# Patient Record
Sex: Female | Born: 1990 | Hispanic: No | Marital: Married | Smoking: Never smoker
Health system: Southern US, Community
[De-identification: ages and names within clinical notes are randomized; demographics above are authoritative.]

## PROBLEM LIST (undated history)

## (undated) ENCOUNTER — Inpatient Hospital Stay (HOSPITAL_COMMUNITY): Payer: Self-pay

## (undated) DIAGNOSIS — Z789 Other specified health status: Secondary | ICD-10-CM

## (undated) HISTORY — DX: Other specified health status: Z78.9

## (undated) HISTORY — PX: NO PAST SURGERIES: SHX2092

---

## 2016-10-24 ENCOUNTER — Inpatient Hospital Stay (HOSPITAL_COMMUNITY): Payer: Medicaid Other

## 2016-10-24 ENCOUNTER — Encounter (HOSPITAL_COMMUNITY): Payer: Self-pay

## 2016-10-24 ENCOUNTER — Inpatient Hospital Stay (HOSPITAL_COMMUNITY)
Admission: AD | Admit: 2016-10-24 | Discharge: 2016-10-24 | Disposition: A | Discharge status: Home / Self Care | Payer: Medicaid Other | Source: Ambulatory Visit | Attending: Obstetrics & Gynecology | Admitting: Obstetrics & Gynecology

## 2016-10-24 DIAGNOSIS — O283 Abnormal ultrasonic finding on antenatal screening of mother: Secondary | ICD-10-CM | POA: Diagnosis not present

## 2016-10-24 DIAGNOSIS — Z3A33 33 weeks gestation of pregnancy: Secondary | ICD-10-CM | POA: Diagnosis not present

## 2016-10-24 DIAGNOSIS — O26893 Other specified pregnancy related conditions, third trimester: Secondary | ICD-10-CM | POA: Diagnosis not present

## 2016-10-24 DIAGNOSIS — R109 Unspecified abdominal pain: Secondary | ICD-10-CM | POA: Diagnosis present

## 2016-10-24 DIAGNOSIS — Z8759 Personal history of other complications of pregnancy, childbirth and the puerperium: Secondary | ICD-10-CM

## 2016-10-24 LAB — URINE MICROSCOPIC-ADD ON
RBC / HPF: NONE SEEN RBC/hpf (ref 0–5)
WBC UA: NONE SEEN WBC/hpf (ref 0–5)

## 2016-10-24 LAB — URINALYSIS, ROUTINE W REFLEX MICROSCOPIC
BILIRUBIN URINE: NEGATIVE
Glucose, UA: NEGATIVE mg/dL
KETONES UR: NEGATIVE mg/dL
Leukocytes, UA: NEGATIVE
NITRITE: NEGATIVE
PH: 7 (ref 5.0–8.0)
Protein, ur: NEGATIVE mg/dL
Specific Gravity, Urine: <1.005 — ABNORMAL LOW (ref 1.005–1.030)

## 2016-10-24 NOTE — MAU Note (Addendum)
Onset of constant lower abdominal pain since last night, denies vaginal bleeding, positive FM. Patient has a Grade 3 placenta and has her records with her.

## 2016-10-24 NOTE — Discharge Instructions (Signed)
Abdominal Pain During Pregnancy °Belly (abdominal) pain is common during pregnancy. Most of the time, it is not a serious problem. Other times, it can be a sign that something is wrong with the pregnancy. Always tell your doctor if you have belly pain. °Follow these instructions at home: °Monitor your belly pain for any changes. The following actions may help you feel better: °· Do not have sex (intercourse) or put anything in your vagina until you feel better. °· Rest until your pain stops. °· Drink clear fluids if you feel sick to your stomach (nauseous). Do not eat solid food until you feel better. °· Only take medicine as told by your doctor. °· Keep all doctor visits as told. °Get help right away if: °· You are bleeding, leaking fluid, or pieces of tissue come out of your vagina. °· You have more pain or cramping. °· You keep throwing up (vomiting). °· You have pain when you pee (urinate) or have blood in your pee. °· You have a fever. °· You do not feel your baby moving as much. °· You feel very weak or feel like passing out. °· You have trouble breathing, with or without belly pain. °· You have a very bad headache and belly pain. °· You have fluid leaking from your vagina and belly pain. °· You keep having watery poop (diarrhea). °· Your belly pain does not go away after resting, or the pain gets worse. °This information is not intended to replace advice given to you by your health care provider. Make sure you discuss any questions you have with your health care provider. °Document Released: 11/11/2009 Document Revised: 07/01/2016 Document Reviewed: 06/22/2013 °Elsevier Interactive Patient Education © 2017 Elsevier Inc. ° °

## 2016-10-24 NOTE — MAU Provider Note (Signed)
History     CSN: [id number]  Arrival date and time: 10/24/16 1212   None     Chief Complaint  Patient presents with  . Abdominal Pain   Patient is a 25 year old female G1 P0 at 33 weeks and 5 days by LMP and early ultrasound. Patient moved here very recently from [place]. She is undergoing prenatal care at that time there. She has had regular ultrasounds. She does have a grade 3 placenta and has intermittently had oligo through her pregnancy. An ultrasound was reviewed from 11/8 that showed normal AFI but grade 3 placenta.. Additionally showed normal growth.  She reports last night she started with upper abdominal pain worse in the right upper quadrant. She reports its constant and achy in nature. It does worsen with palpation. She denies any vaginal bleeding or contractions. She reports regular fetal movement. She has not had pain similar to this before. She reports regular bowel movements and no dysuria.    OB History    Gravida Para Term Preterm AB Living   1             SAB TAB Ectopic Multiple Live Births                  History reviewed. No pertinent past medical history.  No past surgical history on file.  No family history on file.  Social History  Substance Use Topics  . Smoking status: Not on file  . Smokeless tobacco: Not on file  . Alcohol use Not on file    Allergies: No Known Allergies  Prescriptions Prior to Admission  Medication Sig Dispense Refill Last Dose  . Prenatal Vit-Fe Fumarate-FA (PRENATAL MULTIVITAMIN) TABS tablet Take 1 tablet by mouth at bedtime.    10/23/2016 at Unknown time    Review of Systems  Constitutional: Negative for chills and fever.  HENT: Negative for congestion and hearing loss.   Eyes: Negative for blurred vision and double vision.  Respiratory: Negative for cough and sputum production.   Cardiovascular: Negative for chest pain and orthopnea.  Gastrointestinal: Positive for abdominal pain. Negative for constipation,  diarrhea, heartburn, nausea and vomiting.  Genitourinary: Negative for dysuria, frequency and urgency.  Musculoskeletal: Negative for back pain, myalgias and neck pain.  Skin: Negative for itching and rash.  Neurological: Negative for dizziness and headaches.   Physical Exam   Blood pressure 142/76, pulse 113, temperature 98.2 F (36.8 C), temperature source Oral, height 5\' 2"  (1.575 m), weight 146 lb (66.2 kg).  Physical Exam  Constitutional: She is oriented to person, place, and time. She appears well-developed and well-nourished.  HENT:  Head: Normocephalic and atraumatic.  Cardiovascular: Normal rate and intact distal pulses.   Respiratory: Effort normal. No respiratory distress.  GI: Soft. Bowel sounds are normal. She exhibits no distension. There is no tenderness. There is no rebound.  Genitourinary:  Genitourinary Comments: Patient with closed cervix on digital exam.  Neurological: She is alert and oriented to person, place, and time.  Skin: Skin is warm and dry.  Psychiatric: She has a normal mood and affect. Her behavior is normal.    MAU Course  Procedures  MDM IN MAU patietn underwent fetal monitoring with reactive NST. Records from [place] where reviewed and history of Oligohydramnios and grade 3 placenta where noted. US preformed today showed 8/8 BPP and EFW in the 36% on preliminary reading.   Pain is not worsening and likely is due to fetal position. Cervix is closed.   Assessment  and Plan  1. Abdominal pain in pregnancy: likely from fetal position 2. History of grade 3 placenta and oligohydramnios. US reassuring today.   Ernestina Pennaicholas [employer] 10/24/2016, 1:42 PM

## 2016-11-17 ENCOUNTER — Ambulatory Visit (INDEPENDENT_AMBULATORY_CARE_PROVIDER_SITE_OTHER): Payer: Self-pay | Admitting: Family

## 2016-11-17 ENCOUNTER — Encounter: Payer: Self-pay | Admitting: Family

## 2016-11-17 DIAGNOSIS — Z3403 Encounter for supervision of normal first pregnancy, third trimester: Secondary | ICD-10-CM

## 2016-11-17 DIAGNOSIS — Z113 Encounter for screening for infections with a predominantly sexual mode of transmission: Secondary | ICD-10-CM

## 2016-11-17 DIAGNOSIS — Z3490 Encounter for supervision of normal pregnancy, unspecified, unspecified trimester: Secondary | ICD-10-CM | POA: Insufficient documentation

## 2016-11-17 DIAGNOSIS — Z3400 Encounter for supervision of normal first pregnancy, unspecified trimester: Secondary | ICD-10-CM

## 2016-11-17 DIAGNOSIS — Z8759 Personal history of other complications of pregnancy, childbirth and the puerperium: Secondary | ICD-10-CM

## 2016-11-17 LAB — POCT URINALYSIS DIP (DEVICE)
BILIRUBIN URINE: NEGATIVE
Glucose, UA: NEGATIVE mg/dL
HGB URINE DIPSTICK: NEGATIVE
Ketones, ur: NEGATIVE mg/dL
Nitrite: NEGATIVE
Protein, ur: NEGATIVE mg/dL
SPECIFIC GRAVITY, URINE: 1.01 (ref 1.005–1.030)
UROBILINOGEN UA: 0.2 mg/dL (ref 0.0–1.0)
pH: 7 (ref 5.0–8.0)

## 2016-11-17 LAB — OB RESULTS CONSOLE GC/CHLAMYDIA: GC PROBE AMP, GENITAL: NEGATIVE

## 2016-11-17 LAB — OB RESULTS CONSOLE GBS: STREP GROUP B AG: NEGATIVE

## 2016-11-17 NOTE — Progress Notes (Signed)
  Subjective:    Megan Scott is a G1P0 2394w1d being seen today for her first obstetrical visit.  Pt recently moved from [place] where she received a majority of her prenatal care beginning at 13 wks.  Pregnancy dated by 13 wk ultrasound.  Pt here with folder of records.  Hx significant for intermittent oligohydramnios.  Seen in MAU on 10/24/16 for abdominal pain.  Ultrasound showed 14.68 AFI, 36%ile growth, and 8/8 BPP.  Patient does intend to breast feed. Pregnancy history fully reviewed.  Patient reports no complaints.  Vitals:   11/17/16 1305  BP: 118/60  Pulse: 96  Weight: 151 lb (68.5 kg)    HISTORY: OB History  Gravida Para Term Preterm AB Living  1            SAB TAB Ectopic Multiple Live Births               # Outcome Date GA Lbr Len/2nd Weight Sex Delivery Anes PTL Lv  1 Current              Past Medical History:  Diagnosis Date  . Medical history non-contributory    Past Surgical History:  Procedure Laterality Date  . NO PAST SURGERIES     History reviewed. No pertinent family history.   Exam   Vitals:   11/17/16 1305  BP: 118/60  Pulse: 96   Vitals:   11/17/16 1305  BP: 118/60  Pulse: 96  Weight: 151 lb (68.5 kg)    Fetal Status: Fetal Heart Rate (bpm): 142   Movement: Present     General:  Alert, oriented and cooperative. Patient is in no acute distress.  Skin: Skin is warm and dry. No rash noted.   Cardiovascular: Normal heart rate noted  Respiratory: Normal respiratory effort, no problems with respiration noted  Abdomen: Soft, gravid, appropriate for gestational age. Pain/Pressure: Present     Pelvic: Vag. Bleeding: None     Cervical exam deferred        Extremities: Normal range of motion.  Edema: None  Mental Status: Normal mood and affect. Normal behavior. Normal judgment and thought content.   Urinalysis:       Assessment:    Pregnancy: G1P0 Patient Active Problem List   Diagnosis Date Noted  . Supervision of normal  pregnancy, antepartum 11/17/2016        Plan:   Prenatal labs obtained due to difficulty finding all needed results GBS and GC/CT collected. Prenatal records reviewed.   Prenatal vitamins. Problem list reviewed and updated.  Ultrasound discussed; fetal survey: results reviewed.  Follow up in 1 weeks.  [name], [name] 11/17/2016

## 2016-11-17 NOTE — Progress Notes (Signed)
Hindi interpreter # [phone] used for visit

## 2016-11-18 ENCOUNTER — Other Ambulatory Visit: Payer: Self-pay | Admitting: *Deleted

## 2016-11-18 DIAGNOSIS — Z3490 Encounter for supervision of normal pregnancy, unspecified, unspecified trimester: Secondary | ICD-10-CM

## 2016-11-18 LAB — PRENATAL PROFILE (SOLSTAS)
Antibody Screen: NEGATIVE
BASOS ABS: 0 {cells}/uL (ref 0–200)
BASOS PCT: 0 %
EOS PCT: 2 %
Eosinophils Absolute: 202 cells/uL (ref 15–500)
HEMATOCRIT: 39 % (ref 35.0–45.0)
HEMOGLOBIN: 13.1 g/dL (ref 11.7–15.5)
HEP B S AG: NEGATIVE
HIV 1&2 Ab, 4th Generation: NONREACTIVE
LYMPHS ABS: 2222 {cells}/uL (ref 850–3900)
Lymphocytes Relative: 22 %
MCH: 29.2 pg (ref 27.0–33.0)
MCHC: 33.6 g/dL (ref 32.0–36.0)
MCV: 87.1 fL (ref 80.0–100.0)
MPV: 10.3 fL (ref 7.5–12.5)
Monocytes Absolute: 909 cells/uL (ref 200–950)
Monocytes Relative: 9 %
NEUTROS ABS: 6767 {cells}/uL (ref 1500–7800)
Neutrophils Relative %: 67 %
Platelets: 343 10*3/uL (ref 140–400)
RBC: 4.48 MIL/uL (ref 3.80–5.10)
RDW: 13.9 % (ref 11.0–15.0)
RUBELLA: 8.15 {index} — AB (ref <0.90)
Rh Type: POSITIVE
WBC: 10.1 10*3/uL (ref 3.8–10.8)

## 2016-11-18 LAB — GC/CHLAMYDIA PROBE AMP (~~LOC~~) NOT AT ARMC
CHLAMYDIA, DNA PROBE: NEGATIVE
Neisseria Gonorrhea: NEGATIVE

## 2016-11-18 NOTE — Progress Notes (Unsigned)
[facility]. [facility]

## 2016-11-20 LAB — CULTURE, BETA STREP (GROUP B ONLY)

## 2016-11-22 NOTE — Addendum Note (Signed)
Addended by: [name], Brya Simerly N on: 11/22/2016 11:20 PM   Modules accepted: K[employer] Brood[employer]

## 2016-11-24 ENCOUNTER — Ambulatory Visit (INDEPENDENT_AMBULATORY_CARE_PROVIDER_SITE_OTHER): Payer: Self-pay | Admitting: Student

## 2016-11-24 DIAGNOSIS — Z3403 Encounter for supervision of normal first pregnancy, third trimester: Secondary | ICD-10-CM

## 2016-11-24 DIAGNOSIS — Z3400 Encounter for supervision of normal first pregnancy, unspecified trimester: Secondary | ICD-10-CM

## 2016-11-24 NOTE — Progress Notes (Signed)
   PRENATAL VISIT NOTE  Subjective:  Megan Scott is a 25 y.o. G1P0 at 8725w1d being seen today for ongoing prenatal care.  She is currently monitored for the following issues for this high-risk pregnancy and has Supervision of normal pregnancy, antepartum and History of oligohydramnios on her problem list.  Patient reports no complaints.  Contractions: Irritability. Vag. Bleeding: None.  Movement: Present. Denies leaking of fluid.   The following portions of the patient's history were reviewed and updated as appropriate: allergies, current medications, past family history, past medical history, past social history, past surgical history and problem list. Problem list updated.  Objective:   Vitals:   11/24/16 1303  BP: 118/77  Pulse: (!) 108  Weight: 151 lb (68.5 kg)    Fetal Status: Fetal Heart Rate (bpm): 132 Fundal Height: 37 cm Movement: Present  Presentation: Vertex  General:  Alert, oriented and cooperative. Patient is in no acute distress.  Skin: Skin is warm and dry. No rash noted.   Cardiovascular: Normal heart rate noted  Respiratory: Normal respiratory effort, no problems with respiration noted  Abdomen: Soft, gravid, appropriate for gestational age. Pain/Pressure: Present     Pelvic:  Cervical exam performed Dilation: Fingertip Effacement (%): 50 Station: -3  Extremities: Normal range of motion.  Edema: Trace  Mental Status: Normal mood and affect. Normal behavior. Normal judgment and thought content.   Assessment and Plan:  Pregnancy: G1P0 at 2125w1d  1. Supervision of normal first pregnancy, antepartum - per review of ultrasound done in [place] at 32 weeks -- grade 3 placenta -- calcifications also noted per ultrasound done in MAU 11/18 -f/u ultrasound ordered to check growth, AFI, & placenta to determine if IOL at 39 wks necessary (per Dr. Einar Nolasco FullingHarraway-Smith) - US MFM OB FOLLOW UP; Future  Term labor symptoms and general obstetric precautions including but not  limited to vaginal bleeding, contractions, leaking of fluid and fetal movement were reviewed in detail with the patient. Please refer to After Visit Summary for other counseling recommendations.  Return in about 1 week (around 12/01/2016) for Routine OB.   Judeth HornErin Antonios Ostrow, NP

## 2016-11-24 NOTE — Progress Notes (Signed)
OB f/u US scheduled for 11/27/16 @ 1430.  Pt notified.

## 2016-11-24 NOTE — Patient Instructions (Signed)

## 2016-11-27 ENCOUNTER — Ambulatory Visit (HOSPITAL_COMMUNITY)
Admission: RE | Admit: 2016-11-27 | Discharge: 2016-11-27 | Disposition: A | Discharge status: Home / Self Care | Payer: Medicaid Other | Source: Ambulatory Visit | Attending: Student | Admitting: Student

## 2016-11-27 DIAGNOSIS — Z3400 Encounter for supervision of normal first pregnancy, unspecified trimester: Secondary | ICD-10-CM

## 2016-11-27 DIAGNOSIS — Z3403 Encounter for supervision of normal first pregnancy, third trimester: Secondary | ICD-10-CM | POA: Insufficient documentation

## 2016-11-27 DIAGNOSIS — Z3A38 38 weeks gestation of pregnancy: Secondary | ICD-10-CM | POA: Diagnosis not present

## 2016-12-01 ENCOUNTER — Ambulatory Visit (INDEPENDENT_AMBULATORY_CARE_PROVIDER_SITE_OTHER): Payer: Self-pay | Admitting: Student

## 2016-12-01 DIAGNOSIS — Z8759 Personal history of other complications of pregnancy, childbirth and the puerperium: Secondary | ICD-10-CM

## 2016-12-01 DIAGNOSIS — Z3400 Encounter for supervision of normal first pregnancy, unspecified trimester: Secondary | ICD-10-CM

## 2016-12-01 DIAGNOSIS — Z3403 Encounter for supervision of normal first pregnancy, third trimester: Secondary | ICD-10-CM

## 2016-12-01 NOTE — Progress Notes (Addendum)
   PRENATAL VISIT NOTE  Subjective:  Megan Scott is a 25 y.o. G1P0 at 354w1d being seen today for ongoing prenatal care.  She is currently monitored for the following issues for this low-risk pregnancy and has Supervision of normal pregnancy, antepartum and History of oligohydramnios on her problem list.  Patient reports no complaints.  Contractions: Irritability. Vag. Bleeding: None.  Movement: Present. Denies leaking of fluid.   The following portions of the patient's history were reviewed and updated as appropriate: allergies, current medications, past family history, past medical history, past social history, past surgical history and problem list. Problem list updated.  Objective:   Vitals:   12/01/16 1318  BP: 128/82  Pulse: 99  Temp: 98.4 F (36.9 C)  Weight: 155 lb 4.8 oz (70.4 kg)    Fetal Status: Fetal Heart Rate (bpm): 135 Fundal Height: 38 cm Movement: Present  Presentation: Vertex  General:  Alert, oriented and cooperative. Patient is in no acute distress.  Skin: Skin is warm and dry. No rash noted.   Cardiovascular: Normal heart rate noted  Respiratory: Normal respiratory effort, no problems with respiration noted  Abdomen: Soft, gravid, appropriate for gestational age. Pain/Pressure: Absent     Pelvic:  Cervical exam performed Dilation: 1 Effacement (%): 50 Station: -3  Extremities: Normal range of motion.  Edema: Mild pitting, slight indentation  Mental Status: Normal mood and affect. Normal behavior. Normal judgment and thought content.   Assessment and Plan:  Pregnancy: G1P0 at 4154w1d  1. Supervision of normal first pregnancy, antepartum Patient feeling well; no complaints.  No further testing is necessary based on most recent US on 11/27/2016.  Plan of care confirmed with Dr. Debroah LoopArnold.   Term labor symptoms and general obstetric precautions including but not limited to vaginal bleeding, contractions, leaking of fluid and fetal movement were reviewed  in detail with the patient. Please refer to After Visit Summary for other counseling recommendations.  Return in about 1 week (around 12/08/2016) for ob fup.   Megan Scott, CNM

## 2016-12-01 NOTE — Patient Instructions (Signed)
Braxton Hicks Contractions °Contractions of the uterus can occur throughout pregnancy. Contractions are not always a sign that you are in labor.  °WHAT ARE BRAXTON HICKS CONTRACTIONS?  °Contractions that occur before labor are called Braxton Hicks contractions, or false labor. Toward the end of pregnancy (32-34 weeks), these contractions can develop more often and may become more forceful. This is not true labor because these contractions do not result in opening (dilatation) and thinning of the cervix. They are sometimes difficult to tell apart from true labor because these contractions can be forceful and people have different pain tolerances. You should not feel embarrassed if you go to the hospital with false labor. Sometimes, the only way to tell if you are in true labor is for your health care provider to look for changes in the cervix. °If there are no prenatal problems or other health problems associated with the pregnancy, it is completely safe to be sent home with false labor and await the onset of true labor. °HOW CAN YOU TELL THE DIFFERENCE BETWEEN TRUE AND FALSE LABOR? °False Labor  °· The contractions of false labor are usually shorter and not as hard as those of true labor.   °· The contractions are usually irregular.   °· The contractions are often felt in the front of the lower abdomen and in the groin.   °· The contractions may go away when you walk around or change positions while lying down.   °· The contractions get weaker and are shorter lasting as time goes on.   °· The contractions do not usually become progressively stronger, regular, and closer together as with true labor.   °True Labor  °· Contractions in true labor last 30-70 seconds, become very regular, usually become more intense, and increase in frequency.   °· The contractions do not go away with walking.   °· The discomfort is usually felt in the top of the uterus and spreads to the lower abdomen and low back.   °· True labor can be  determined by your health care provider with an exam. This will show that the cervix is dilating and getting thinner.   °WHAT TO REMEMBER °· Keep up with your usual exercises and follow other instructions given by your health care provider.   °· Take medicines as directed by your health care provider.   °· Keep your regular prenatal appointments.   °· Eat and drink lightly if you think you are going into labor.   °· If Braxton Hicks contractions are making you uncomfortable:   °¨ Change your position from lying down or resting to walking, or from walking to resting.   °¨ Sit and rest in a tub of warm water.   °¨ Drink 2-3 glasses of water. Dehydration may cause these contractions.   °¨ Do slow and deep breathing several times an hour.   °WHEN SHOULD I SEEK IMMEDIATE MEDICAL CARE? °Seek immediate medical care if: °· Your contractions become stronger, more regular, and closer together.   °· You have fluid leaking or gushing from your vagina.   °· You have a fever.   °· You pass blood-tinged mucus.   °· You have vaginal bleeding.   °· You have continuous abdominal pain.   °· You have low back pain that you never had before.   °· You feel your baby's head pushing down and causing pelvic pressure.   °· Your baby is not moving as much as it used to.   °This information is not intended to replace advice given to you by your health care provider. Make sure you discuss any questions you have with your health care   provider. °Document Released: 11/23/2005 Document Revised: 03/16/2016 Document Reviewed: 09/04/2013 °Elsevier Interactive Patient Education © 2017 Elsevier Inc. ° °

## 2016-12-07 NOTE — L&D Delivery Note (Signed)
26 y.o. G1P0 at 6963w3d delivered a viable female infant via SVD @1150  in cephalic, LOA. Anterior shoulder delivered with ease. 60 sec delayed cord clamping. Nuchal cord x1, tight, delivered through. Cord clamped x2 and cut. Placenta delivered spontaneously intact, with 3VC. Fundus firm on exam with massage and pitocin. Good hemostasis noted.  Laceration: 2nd degree deep and left labial Suture: 3.0 Vicryl Good hemostasis noted. EBL: 150cc  Mom and baby recovering in LDR.    Apgars: 9/9 Weight: pending  Megan Muscaaniel L Warden, MD PGY-1 12/10/2016, 12:33 PM   Midwife attestation: I was gloved and present for delivery in its entirety and I agree with the above resident's note.  Megan Scott, CNM 7:22 PM

## 2016-12-08 ENCOUNTER — Ambulatory Visit (INDEPENDENT_AMBULATORY_CARE_PROVIDER_SITE_OTHER): Payer: Self-pay | Admitting: Obstetrics & Gynecology

## 2016-12-08 VITALS — BP 124/76 | HR 100 | Wt 157.5 lb

## 2016-12-08 DIAGNOSIS — Z3403 Encounter for supervision of normal first pregnancy, third trimester: Secondary | ICD-10-CM

## 2016-12-08 DIAGNOSIS — Z3400 Encounter for supervision of normal first pregnancy, unspecified trimester: Secondary | ICD-10-CM

## 2016-12-08 NOTE — Progress Notes (Signed)
Reactive NST today   PRENATAL VISIT NOTE  Subjective:  Megan Scott is a 26 y.o. G1P0 at 2250w1d being seen today for ongoing prenatal care.  She is currently monitored for the following issues for this low-risk pregnancy and has Supervision of normal pregnancy, antepartum and History of oligohydramnios on her problem list.  Patient reports no complaints.  Contractions: Irregular. Vag. Bleeding: None.  Movement: Present. Denies leaking of fluid.   The following portions of the patient's history were reviewed and updated as appropriate: allergies, current medications, past family history, past medical history, past social history, past surgical history and problem list. Problem list updated.  Objective:   Vitals:   12/08/16 1320  BP: 124/76  Pulse: 100  Weight: 157 lb 8 oz (71.4 kg)    Fetal Status: Fetal Heart Rate (bpm): NST   Movement: Present     General:  Alert, oriented and cooperative. Patient is in no acute distress.  Skin: Skin is warm and dry. No rash noted.   Cardiovascular: Normal heart rate noted  Respiratory: Normal respiratory effort, no problems with respiration noted  Abdomen: Soft, gravid, appropriate for gestational age. Pain/Pressure: Present     Pelvic:  Cervical exam performed        Extremities: Normal range of motion.  Edema: Trace  Mental Status: Normal mood and affect. Normal behavior. Normal judgment and thought content.   Assessment and Plan:  Pregnancy: G1P0 at 4750w1d  1. Supervision of normal first pregnancy, antepartum Reactive test today - Fetal nonstress test  Term labor symptoms and general obstetric precautions including but not limited to vaginal bleeding, contractions, leaking of fluid and fetal movement were reviewed in detail with the patient. Please refer to After Visit Summary for other counseling recommendations.  Return in about 6 weeks (around 01/19/2017) for postpartum. Requested IOL before 41 weeks as her husband must  travel to [place] 12/15/16, scheduled for 40.5 in 4 days Adam PhenixJames G Krishna Dancel, MD

## 2016-12-08 NOTE — Progress Notes (Signed)
Interpreter Riz present for encounter.  Pr desires Cx exam and IOL ASAP due to husband is leaving the country on 12/15/16.

## 2016-12-10 ENCOUNTER — Inpatient Hospital Stay (HOSPITAL_COMMUNITY): Payer: Medicaid Other | Admitting: Anesthesiology

## 2016-12-10 ENCOUNTER — Encounter (HOSPITAL_COMMUNITY): Payer: Self-pay

## 2016-12-10 ENCOUNTER — Inpatient Hospital Stay (HOSPITAL_COMMUNITY)
Admission: AD | Admit: 2016-12-10 | Discharge: 2016-12-11 | DRG: 775 | Disposition: A | Discharge status: Home / Self Care | Payer: Medicaid Other | Source: Ambulatory Visit | Attending: Obstetrics & Gynecology | Admitting: Obstetrics & Gynecology

## 2016-12-10 DIAGNOSIS — Z8759 Personal history of other complications of pregnancy, childbirth and the puerperium: Secondary | ICD-10-CM

## 2016-12-10 DIAGNOSIS — Z3A40 40 weeks gestation of pregnancy: Secondary | ICD-10-CM

## 2016-12-10 DIAGNOSIS — O4202 Full-term premature rupture of membranes, onset of labor within 24 hours of rupture: Secondary | ICD-10-CM | POA: Diagnosis present

## 2016-12-10 LAB — CBC
HCT: 36.9 % (ref 36.0–46.0)
HEMOGLOBIN: 12.8 g/dL (ref 12.0–15.0)
MCH: 29.4 pg (ref 26.0–34.0)
MCHC: 34.7 g/dL (ref 30.0–36.0)
MCV: 84.6 fL (ref 78.0–100.0)
Platelets: 308 10*3/uL (ref 150–400)
RBC: 4.36 MIL/uL (ref 3.87–5.11)
RDW: 14 % (ref 11.5–15.5)
WBC: 10.7 10*3/uL — AB (ref 4.0–10.5)

## 2016-12-10 LAB — TYPE AND SCREEN
ABO/RH(D): B POS
ANTIBODY SCREEN: NEGATIVE

## 2016-12-10 LAB — ABO/RH: ABO/RH(D): B POS

## 2016-12-10 MED ORDER — LACTATED RINGERS IV SOLN
500.0000 mL | Freq: Once | INTRAVENOUS | Status: AC
  Administered 2016-12-10: 1000 mL via INTRAVENOUS

## 2016-12-10 MED ORDER — PHENYLEPHRINE 40 MCG/ML (10ML) SYRINGE FOR IV PUSH (FOR BLOOD PRESSURE SUPPORT)
80.0000 ug | PREFILLED_SYRINGE | INTRAVENOUS | Status: DC | PRN
  Filled 2016-12-10: qty 5

## 2016-12-10 MED ORDER — DIBUCAINE 1 % RE OINT: 1.0000 "application " | TOPICAL_OINTMENT | RECTAL | Status: DC | PRN

## 2016-12-10 MED ORDER — LIDOCAINE HCL (PF) 1 % IJ SOLN
INTRAMUSCULAR | Status: DC | PRN
  Administered 2016-12-10: 4 mL via EPIDURAL

## 2016-12-10 MED ORDER — WITCH HAZEL-GLYCERIN EX PADS
1.0000 | MEDICATED_PAD | CUTANEOUS | Status: DC | PRN
Start: 2016-12-10 — End: 2016-12-11

## 2016-12-10 MED ORDER — DIPHENHYDRAMINE HCL 50 MG/ML IJ SOLN: 12.5000 mg | INTRAMUSCULAR | Status: DC | PRN

## 2016-12-10 MED ORDER — LACTATED RINGERS IV SOLN: 500.0000 mL | INTRAVENOUS | Status: DC | PRN

## 2016-12-10 MED ORDER — LIDOCAINE HCL (PF) 1 % IJ SOLN
30.0000 mL | INTRAMUSCULAR | Status: DC | PRN
  Administered 2016-12-10: 30 mL via SUBCUTANEOUS
  Filled 2016-12-10: qty 30

## 2016-12-10 MED ORDER — PRENATAL MULTIVITAMIN CH
1.0000 | ORAL_TABLET | Freq: Every day | ORAL | Status: DC
  Administered 2016-12-10 – 2016-12-11 (×2): 1 via ORAL
  Filled 2016-12-10 (×3): qty 1

## 2016-12-10 MED ORDER — ACETAMINOPHEN 325 MG PO TABS: 650.0000 mg | ORAL_TABLET | ORAL | Status: DC | PRN

## 2016-12-10 MED ORDER — LACTATED RINGERS IV SOLN: 500.0000 mL | Freq: Once | INTRAVENOUS | Status: DC

## 2016-12-10 MED ORDER — BENZOCAINE-MENTHOL 20-0.5 % EX AERO
1.0000 "application " | INHALATION_SPRAY | CUTANEOUS | Status: DC | PRN
  Administered 2016-12-10: 1 via TOPICAL
  Filled 2016-12-10: qty 56

## 2016-12-10 MED ORDER — FENTANYL 2.5 MCG/ML BUPIVACAINE 1/10 % EPIDURAL INFUSION (WH - ANES)
14.0000 mL/h | INTRAMUSCULAR | Status: DC | PRN
Start: 2016-12-10 — End: 2016-12-10
  Administered 2016-12-10: 14 mL/h via EPIDURAL
  Filled 2016-12-10: qty 100

## 2016-12-10 MED ORDER — EPHEDRINE 5 MG/ML INJ
10.0000 mg | INTRAVENOUS | Status: DC | PRN
  Filled 2016-12-10: qty 4

## 2016-12-10 MED ORDER — LACTATED RINGERS IV SOLN
INTRAVENOUS | Status: DC
  Administered 2016-12-10 (×2): via INTRAVENOUS

## 2016-12-10 MED ORDER — OXYTOCIN BOLUS FROM INFUSION
500.0000 mL | Freq: Once | INTRAVENOUS | Status: AC
  Administered 2016-12-10: 500 mL via INTRAVENOUS

## 2016-12-10 MED ORDER — ONDANSETRON HCL 4 MG/2ML IJ SOLN: 4.0000 mg | Freq: Four times a day (QID) | INTRAMUSCULAR | Status: DC | PRN

## 2016-12-10 MED ORDER — SOD CITRATE-CITRIC ACID 500-334 MG/5ML PO SOLN: 30.0000 mL | ORAL | Status: DC | PRN

## 2016-12-10 MED ORDER — PHENYLEPHRINE 40 MCG/ML (10ML) SYRINGE FOR IV PUSH (FOR BLOOD PRESSURE SUPPORT)
80.0000 ug | PREFILLED_SYRINGE | INTRAVENOUS | Status: DC | PRN
  Filled 2016-12-10: qty 5
  Filled 2016-12-10: qty 10

## 2016-12-10 MED ORDER — SENNOSIDES-DOCUSATE SODIUM 8.6-50 MG PO TABS
2.0000 | ORAL_TABLET | ORAL | Status: DC
  Administered 2016-12-11 (×2): 2 via ORAL
  Filled 2016-12-10 (×2): qty 2

## 2016-12-10 MED ORDER — COCONUT OIL OIL: 1.0000 "application " | TOPICAL_OIL | Status: DC | PRN

## 2016-12-10 MED ORDER — ONDANSETRON HCL 4 MG/2ML IJ SOLN: 4.0000 mg | INTRAMUSCULAR | Status: DC | PRN

## 2016-12-10 MED ORDER — SIMETHICONE 80 MG PO CHEW: 80.0000 mg | CHEWABLE_TABLET | ORAL | Status: DC | PRN

## 2016-12-10 MED ORDER — OXYCODONE-ACETAMINOPHEN 5-325 MG PO TABS: 1.0000 | ORAL_TABLET | ORAL | Status: DC | PRN

## 2016-12-10 MED ORDER — TETANUS-DIPHTH-ACELL PERTUSSIS 5-2.5-18.5 LF-MCG/0.5 IM SUSP: 0.5000 mL | Freq: Once | INTRAMUSCULAR | Status: DC

## 2016-12-10 MED ORDER — IBUPROFEN 600 MG PO TABS
600.0000 mg | ORAL_TABLET | Freq: Four times a day (QID) | ORAL | Status: DC
  Administered 2016-12-10 – 2016-12-11 (×4): 600 mg via ORAL
  Filled 2016-12-10 (×4): qty 1

## 2016-12-10 MED ORDER — OXYCODONE-ACETAMINOPHEN 5-325 MG PO TABS: 2.0000 | ORAL_TABLET | ORAL | Status: DC | PRN

## 2016-12-10 MED ORDER — DIPHENHYDRAMINE HCL 25 MG PO CAPS: 25.0000 mg | ORAL_CAPSULE | Freq: Four times a day (QID) | ORAL | Status: DC | PRN

## 2016-12-10 MED ORDER — PHENYLEPHRINE 40 MCG/ML (10ML) SYRINGE FOR IV PUSH (FOR BLOOD PRESSURE SUPPORT)
80.0000 ug | PREFILLED_SYRINGE | INTRAVENOUS | Status: DC | PRN
Start: 2016-12-10 — End: 2016-12-10

## 2016-12-10 MED ORDER — ONDANSETRON HCL 4 MG PO TABS: 4.0000 mg | ORAL_TABLET | ORAL | Status: DC | PRN

## 2016-12-10 MED ORDER — ZOLPIDEM TARTRATE 5 MG PO TABS: 5.0000 mg | ORAL_TABLET | Freq: Every evening | ORAL | Status: DC | PRN

## 2016-12-10 MED ORDER — OXYTOCIN 40 UNITS IN LACTATED RINGERS INFUSION - SIMPLE MED
2.5000 [IU]/h | INTRAVENOUS | Status: DC
  Filled 2016-12-10: qty 1000

## 2016-12-10 MED ORDER — EPHEDRINE 5 MG/ML INJ: 10.0000 mg | INTRAVENOUS | Status: DC | PRN

## 2016-12-10 MED ORDER — FLEET ENEMA 7-19 GM/118ML RE ENEM: 1.0000 | ENEMA | Freq: Every day | RECTAL | Status: DC | PRN

## 2016-12-10 NOTE — MAU Note (Signed)
PT  SAYS SROM  AT 0100-   GREEN  FLUID -     HAS FELT BABY MOVING.     [facility]   [facility]   2  CM    ON Tuesday   .   DENIES  HSV AND MRSA.   GBS- NEG

## 2016-12-10 NOTE — Lactation Note (Signed)
This note was copied from a baby's chart. Lactation Consultation Note  Patient Name: Megan Scott Hainer [id number]'XToday's Date: 12/10/2016 Reason for consult: Initial assessment;Difficult latch This is mom's first baby and newborn is 2 hours old.  RN recently assisted mom with latching baby using a 20 mm nipple shield.  Mom has flat nipples and baby unable to latch without.  Baby latched well with shield per RN but mom could not continue to BF due to severe pain.  Baby currently skin to skin on mom's chest showing feeding cues.  Positioned baby in football hold on left side.  Baby attempted to latch several times without success.  24 mm nipple shield applied to see if bigger shield reduces pain.  After a few attempts baby latched easily but we took baby off when severe pain did not subside after first minute.  Attempted to hand express colostrum to spoon feed baby but no colostrum obtained.  Mom is very tired so encouraged to rest and will attempt later.  If latch remains painful we will set up a DEBP.  Breastfeeding consultation services and support information given and reviewed.  Maternal Data Has patient been taught Hand Expression?: Yes Does the patient have breastfeeding experience prior to this delivery?: No  Feeding Feeding Type: Breast Fed  LATCH Score/Interventions Latch: Repeated attempts needed to sustain latch, nipple held in mouth throughout feeding, stimulation needed to elicit sucking reflex. Intervention(s): Skin to skin;Teach feeding cues;Waking techniques Intervention(s): Adjust position;Assist with latch;Breast massage;Breast compression  Audible Swallowing: None Intervention(s): Skin to skin;Hand expression  Type of Nipple: Flat Intervention(s): Hand pump  Comfort (Breast/Nipple): Soft / non-tender     Hold (Positioning): Assistance needed to correctly position infant at breast and maintain latch. Intervention(s): Breastfeeding basics reviewed;Support Pillows;Position  options;Skin to skin  LATCH Score: 5  Lactation Tools Discussed/Used Tools: Nipple Shields Nipple shield size: 24;20 Pump Review: Setup, frequency, and cleaning;Milk Storage Initiated by:: RN Date initiated:: 12/10/16   Consult Status Consult Status: Follow-up Date: 12/11/16 Follow-up type: In-patient    Huston FoleyMOULDEN, Maleeka Sabatino S 12/10/2016, 2:32 PM

## 2016-12-10 NOTE — Anesthesia Procedure Notes (Signed)
Epidural Patient location during procedure: OB Start time: 12/10/2016 10:26 AM End time: 12/10/2016 10:32 AM  Staffing Anesthesiologist: Shona SimpsonHOLLIS, Ewan Grau D Performed: anesthesiologist   Preanesthetic Checklist Completed: patient identified, site marked, surgical consent, pre-op evaluation, timeout performed, IV checked, risks and benefits discussed and monitors and equipment checked  Epidural Patient position: sitting Prep: ChloraPrep Patient monitoring: heart rate, continuous pulse ox and blood pressure Approach: midline Location: L3-L4 Injection technique: LOR saline  Needle:  Needle type: Tuohy  Needle gauge: 17 G Needle length: 9 cm Catheter type: closed end flexible Catheter size: 20 Guage Test dose: negative and 1.5% lidocaine  Assessment Events: blood not aspirated, injection not painful, no injection resistance and no paresthesia  Additional Notes LOR @ 5.5  Patient identified. Risks/Benefits/Options discussed with patient including but not limited to bleeding, infection, nerve damage, paralysis, failed block, incomplete pain control, headache, blood pressure changes, nausea, vomiting, reactions to medications, itching and postpartum back pain. Confirmed with bedside nurse the patient's most recent platelet count. Confirmed with patient that they are not currently taking any anticoagulation, have any bleeding history or any family history of bleeding disorders. Patient expressed understanding and wished to proceed. All questions were answered. Sterile technique was used throughout the entire procedure. Please see nursing notes for vital signs. Test dose was given through epidural catheter and negative prior to continuing to dose epidural or start infusion. Warning signs of high block given to the patient including shortness of breath, tingling/numbness in hands, complete motor block, or any concerning symptoms with instructions to call for help. Patient was given instructions on  fall risk and not to get out of bed. All questions and concerns addressed with instructions to call with any issues or inadequate analgesia.    Reason for block:procedure for pain

## 2016-12-10 NOTE — Progress Notes (Signed)
Labor Progress Note Cleon GustinJolly Rajendrakumar Sobel is a 26 y.o. G1P0 at 2850w3d presented for SROM  S:  Ctx becoming more painful, feeling rectal pressure, considering epidural.  O:  BP (!) 145/84   Pulse (!) 110   Temp 98.5 F (36.9 C) (Oral)   Resp 20   Ht 5\' 2"  (1.575 m)   Wt 72.6 kg (160 lb)   LMP 03/10/2016   SpO2 99%   BMI 29.26 kg/m  EFM: baseline 125 bpm/ mod variability/ + accels/ early decels  Toco: 2-4 SVE: Dilation: 8 Effacement (%): 100 Cervical Position: Middle Station: 0 Presentation: Vertex Exam by:: Heru Montz  A/P: 26 y.o. G1P0 4250w3d  1. Labor: active 2. FWB: Cat I 3. Pain: Planning epidural Anticipate labor progression and SVD.  Donette LarryMelanie Leotis Isham, CNM 10:33 AM

## 2016-12-10 NOTE — Lactation Note (Signed)
This note was copied from a baby's chart. Lactation Consultation Note Follow up visit at 9 hours of age.  Mom is reporting pain with latch.  Mom has flat nipples with semi compressible tissue and few drops of colostrum visible.   Baby in crib showing feeding cues.  Baby sucks gloved finger with strong rhythmic sucking and good tongue function.  Baby does not extend tongue well at first.  Advised parents of suck training and encouraged syringe feeding as needed for supplement.  Rn to show parents as needed.  Mom was using #20 NS, LC changed to #16 with better fit and improved comfort.  LC assisted with football hold and laid back, where baby did his best, to get baby latched well.  Mom initially reports pain of "8" and then down to "3".  No colostrum visible in NS, but several swallows audible.  LC set up DEBP and encouraged mom to post pump 6-8X/daily.  Mom denies pain with pump. Report given to RN. Vernona RiegerLaura    Patient Name: Megan France RavensJolly Villaflor ZOXWR'UToday's Date: 12/10/2016 Reason for consult: Follow-up assessment;Breast/nipple pain;Difficult latch   Maternal Data Has patient been taught Hand Expression?: Yes  Feeding Feeding Type: Breast Fed Length of feed: 15 min  LATCH Score/Interventions Latch: Repeated attempts needed to sustain latch, nipple held in mouth throughout feeding, stimulation needed to elicit sucking reflex. Intervention(s): Skin to skin;Teach feeding cues;Waking techniques Intervention(s): Adjust position;Assist with latch;Breast massage;Breast compression  Audible Swallowing: A few with stimulation Intervention(s): Skin to skin;Hand expression Intervention(s): Skin to skin;Hand expression;Alternate breast massage  Type of Nipple: Flat Intervention(s): Hand pump;Double electric pump  Comfort (Breast/Nipple): Filling, red/small blisters or bruises, mild/mod discomfort  Problem noted: Mild/Moderate discomfort Interventions (Mild/moderate discomfort): Pre-pump if needed;Hand  expression;Post-pump  Hold (Positioning): Assistance needed to correctly position infant at breast and maintain latch. Intervention(s): Breastfeeding basics reviewed;Support Pillows;Position options;Skin to skin  LATCH Score: 5  Lactation Tools Discussed/Used Tools: Nipple Shields Nipple shield size: 16 Pump Review: Setup, frequency, and cleaning;Milk Storage Initiated by:: JS Date initiated:: 12/10/16   Consult Status Consult Status: Follow-up Date: 12/11/16 Follow-up type: In-patient    Beverely RisenShoptaw, Arvella MerlesJana Lynn 12/10/2016, 9:38 PM

## 2016-12-10 NOTE — Anesthesia Postprocedure Evaluation (Signed)
Anesthesia Post Note  Patient: Megan Scott  Procedure(s) Performed: * No procedures listed *  Patient location during evaluation: Mother Baby Anesthesia Type: Epidural Level of consciousness: awake, awake and alert, oriented and patient cooperative Pain management: pain level controlled Vital Signs Assessment: post-procedure vital signs reviewed and stable Respiratory status: spontaneous breathing, nonlabored ventilation and respiratory function stable Cardiovascular status: stable Postop Assessment: no headache, no backache, no signs of nausea or vomiting and patient able to bend at knees Anesthetic complications: no        Last Vitals:  Vitals:   12/10/16 1345 12/10/16 1450  BP: 118/72 (!) 114/58  Pulse: (!) 115 (!) 106  Resp: 20 18  Temp: 37.2 C 36.9 C    Last Pain:  Vitals:   12/10/16 1450  TempSrc: Oral  PainSc: 0-No pain   Pain Goal: Patients Stated Pain Goal: 1 (12/10/16 0445)               Landyn Buckalew L

## 2016-12-10 NOTE — Progress Notes (Signed)
CNM Philipp DeputyKim Shaw verbally aware pt status. Pt request to walk in halls on unt and in pt room. CNM shaw verbally indicated pt my walk unit & room.

## 2016-12-10 NOTE — Anesthesia Pain Management Evaluation Note (Signed)
  CRNA Pain Management Visit Note  Patient: Megan Scott, 26 y.o., female  "Hello I am a member of the anesthesia team at [facility]. We have an anesthesia team available at all times to provide care throughout the hospital, including epidural management and anesthesia for C-section. I don't know your plan for the delivery whether it a natural birth, water birth, IV sedation, nitrous supplementation, doula or epidural, but we want to meet your pain goals."   1.Was your pain managed to your expectations on prior hospitalizations?   No prior hospitalizations  2.What is your expectation for pain management during this hospitalization?     Epidural, IV pain meds and Nitrous Oxide  3.How can we help you reach that goal? This is their first pregnancy and she was open to discussion about all pain management options.   Record the patient's initial score and the patient's pain goal.   Pain: 6  Pain Goal: 8 [facility] wants you to be able to say your pain was always managed very well.  Megan Scott 12/10/2016

## 2016-12-10 NOTE — MAU Note (Signed)
Pt reports ? Leaking fluid since 1:30 am, denies pain.

## 2016-12-10 NOTE — Anesthesia Preprocedure Evaluation (Signed)
Anesthesia Evaluation  Patient identified by MRN, date of birth, ID band Patient awake    Reviewed: Allergy & Precautions, Patient's Chart, lab work & pertinent test results  Airway Mallampati: I       Dental  (+) Teeth Intact   Pulmonary neg pulmonary ROS,    breath sounds clear to auscultation       Cardiovascular negative cardio ROS   Rhythm:Regular Rate:Normal     Neuro/Psych negative neurological ROS  negative psych ROS   GI/Hepatic negative GI ROS, Neg liver ROS,   Endo/Other  negative endocrine ROS  Renal/GU negative Renal ROS  negative genitourinary   Musculoskeletal negative musculoskeletal ROS (+)   Abdominal   Peds negative pediatric ROS (+)  Hematology negative hematology ROS (+)   Anesthesia Other Findings   Reproductive/Obstetrics (+) Pregnancy                             Lab Results  Component Value Date   WBC 10.7 (H) 12/10/2016   HGB 12.8 12/10/2016   HCT 36.9 12/10/2016   MCV 84.6 12/10/2016   PLT 308 12/10/2016   No results found for: INR, PROTIME   Anesthesia Physical Anesthesia Plan  ASA: II  Anesthesia Plan: Epidural   Post-op Pain Management:    Induction:   Airway Management Planned:   Additional Equipment:   Intra-op Plan:   Post-operative Plan:   Informed Consent: I have reviewed the patients History and Physical, chart, labs and discussed the procedure including the risks, benefits and alternatives for the proposed anesthesia with the patient or authorized representative who has indicated his/her understanding and acceptance.     Plan Discussed with:   Anesthesia Plan Comments:         Anesthesia Quick Evaluation

## 2016-12-10 NOTE — H&P (Signed)
LABOR ADMISSION HISTORY AND PHYSICAL  Megan Scott is a 26 y.o. female G1P0 with IUP at 2463w3d by 13 wk U/S presenting for SROM @ 0100. [place] care in the [place] At 7811w1d, initiated prenatal care at 13 wks in [place]. She reports +FM, + contractions, LOF with green color, no VB, no blurry vision, headaches or peripheral edema, and RUQ pain.  She plans on breast and bottle feeding. She undecided about birth control.  Dating: By 13 wk U/S --->  Estimated Date of Delivery: 12/07/16  Sono:  32 wk U/S showing Grade 3 placenta  @[redacted]w[redacted]d , CWD, normal anatomy, cepahlic presentation, 3248g, 16%62% EFW  Prenatal History/Complications: Hx of apparent oligohydramnios in [place] but none seen on recent U/S Hx of 32 wk U/S showing Grade 3 placenta in [place]  Past Medical History: Past Medical History:  Diagnosis Date  . Medical history non-contributory     Past Surgical History: Past Surgical History:  Procedure Laterality Date  . NO PAST SURGERIES      Obstetrical History: OB History    Gravida Para Term Preterm AB Living   1             SAB TAB Ectopic Multiple Live Births                  Social History: Social History   Social History  . Marital status: Married    Spouse name: N/A  . Number of children: N/A  . Years of education: N/A   Social History Main Topics  . Smoking status: Never Smoker  . Smokeless tobacco: Never Used  . Alcohol use No  . Drug use: Unknown  . Sexual activity: Not on file   Other Topics Concern  . Not on file   Social History Narrative  . No narrative on file    Family History: No family history on file.  Allergies: No Known Allergies  Prescriptions Prior to Admission  Medication Sig Dispense Refill Last Dose  . Prenatal Vit-Fe Fumarate-FA (PRENATAL MULTIVITAMIN) TABS tablet Take 1 tablet by mouth at bedtime.    12/10/2016 at Unknown time     Review of Systems   All systems reviewed and negative except as stated in HPI  BP  133/82   Pulse 116   Temp 99 F (37.2 C) (Oral)   Resp 16   Ht 5\' 2"  (1.575 m)   Wt 72.6 kg (160 lb)   LMP 03/10/2016   SpO2 99%   BMI 29.26 kg/m  General appearance: alert, cooperative, appears stated age and no distress Lungs: clear to auscultation bilaterally Heart: regular rate and rhythm Abdomen: soft, non-tender; bowel sounds normal Pelvic: Dilation: 4 Effacement (%): 50 Cervical Position: Posterior Station: -1 Presentation: Vertex Exam by:: Anders SimmondsGambino, Christina MD Extremities: Homans sign is negative, no sign of DVT, edema DTR's normal Presentation: cephalic Fetal monitoringBaseline: 135 bpm, Variability: Good {> 6 bpm), Accelerations: Reactive and Decelerations: None Uterine activityFrequency: Every 2-4 minutes  Prenatal labs: ABO, Rh: B/POS/-- (12/12 1349) Antibody: NEG (12/12 1349) Rubella: Immune RPR: NON REAC (12/12 1349)  HBsAg: NEGATIVE (12/12 1349)  HIV: NONREACTIVE (12/12 1349)  GBS:   Negative 1 hr Glucola Normal Genetic screening  Normal Anatomy US 33 wk normal   Prenatal Transfer Tool  Maternal Diabetes: No Genetic Screening: Normal Maternal Ultrasounds/Referrals: Abnormal:  Findings:   Other: grade 3 placenta at 32 wks Fetal Ultrasounds or other Referrals:  None Maternal Substance Abuse:  No Significant Maternal Medications:  None Significant Maternal Lab  Results: Lab values include: Group B Strep negative  No results found for this or any previous visit (from the past 24 hour(s)).  Patient Active Problem List   Diagnosis Date Noted  . Supervision of normal pregnancy, antepartum 11/17/2016  . History of oligohydramnios 11/17/2016    Assessment: Megan Scott is a 26 y.o. G1P0 at [redacted]w[redacted]d here for SROM  #Labor: Progressing normally. Will hold off on Pitocin at this time #Pain: Patient undecided if she wants IV pain meds or epidural #FWB:  Cat 1 #ID: GBS neg #MOF: breast and bottle #MOC:undecided #Circ: unsure of sex of  baby  Anders Simmonds, MD [facility], PGY-2  CNM attestation:  I have seen and examined this patient; I agree with above documentation in the resident's note.   Megan Scott is a 26 y.o. G1P0 here for SROM/early labor  PE: BP 122/79   Pulse (!) 108   Temp 98.5 F (36.9 C) (Oral)   Resp 20   Ht 5\' 2"  (1.575 m)   Wt 72.6 kg (160 lb)   LMP 03/10/2016   SpO2 99%   BMI 29.26 kg/m  Gen: calm comfortable, NAD Resp: normal effort, no distress Abd: gravid  ROS, labs, PMH reviewed  Plan: Admit to [facility] Expectant management Will augment prn Anticipate SVD  Cam Hai CNM 12/10/2016, 7:37 AM

## 2016-12-10 NOTE — Progress Notes (Signed)
Rcv'd pt from MAU via WC w/ LR infusing L arm. Pt G1p0 6237w3d, SROM [place]. Pt place on EFM. All instruction given. CL & BS table placed by pt. Pt verbal verbalized understanding of all discharge instruction. Stable [facility] reactive 135, UC every 3-4. Cont poc.

## 2016-12-11 LAB — RPR: RPR Ser Ql: NONREACTIVE

## 2016-12-11 MED ORDER — IBUPROFEN 600 MG PO TABS: 600.0000 mg | ORAL_TABLET | Freq: Four times a day (QID) | ORAL | 0 refills | Status: AC

## 2016-12-11 MED ORDER — SENNOSIDES-DOCUSATE SODIUM 8.6-50 MG PO TABS: 2.0000 | ORAL_TABLET | Freq: Every evening | ORAL | 0 refills | Status: AC | PRN

## 2016-12-11 NOTE — Progress Notes (Signed)
UR chart review completed.  

## 2016-12-11 NOTE — Lactation Note (Signed)
This note was copied from a baby's chart. Lactation Consultation Note  Patient Name: Megan France RavensJolly Streng WUJWJ'XToday's Date: 12/11/2016 Reason for consult: Follow-up assessment   With this mom of a term baby, now 6424 hours old, and mom doing well with latching baby despite flat nipples. Mom was shown how to apply nipple shield with suction, as opposed to just placing shield over her nipple. I also showed mom how to hold baby below his head, to obtain a deeper latch. Mom was making a "breathing space: with her thumb. I explained how this pulls her nipple out of the baby's mouth, and she wants the baby to be beyond her nipple to transfer milk. Mom having trouble keeping her hand away from baby's head at first, but did better after a while of seeing baby suckle deeper.  On exam of baby's mouth, he has a very thick lip frenulum, , which extends to the gum line. 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 Mom is still having some pain with latch, even when baby appears to be latched deep with 16 nipple shield.  I will make an o/p lactation consult for mom and baby,  which mom and dad agreed to doing. I also told the parents they could rent a DEP for a month, for $ 107.00, and they seemed interested in this. Mom does not have any insurance or Medicaid, but is willing to purchase a DEP.  I decreased mom to 21 flanges, and will go back and see if she was able to express any colostrum. She now has easily hand expressed colostrum. I praised mom for how well she is doing, despite her obstacles, and she knows to call for questions/conerns.    Maternal Data    Feeding Feeding Type: Breast Fed Length of feed: 30 min (baby was on breast when I walked in the room, on and off briefly, but nutritive sucking noted, clostrum seen in shield)  LATCH Score/Interventions Latch: Repeated attempts needed to sustain latch, nipple held  in mouth throughout feeding, stimulation needed to elicit sucking reflex. Intervention(s): Adjust position;Assist with latch;Breast compression  Audible Swallowing: A few with stimulation Intervention(s): Skin to skin;Hand expression  Type of Nipple: Flat (semi inverted)  Comfort (Breast/Nipple): Filling, red/small blisters or bruises, mild/mod discomfort Problem noted: Cracked, bleeding, blisters, bruises (red, very tender nipples, almost bleeding)  Problem noted: Mild/Moderate discomfort Interventions  (Cracked/bleeding/bruising/blister): Double electric pump;Expressed breast milk to nipple Interventions (Mild/moderate discomfort): Post-pump  Hold (Positioning): Assistance needed to correctly position infant at breast and maintain latch. Intervention(s): Breastfeeding basics reviewed;Support Pillows;Position options;Skin to skin  LATCH Score: 5  Lactation Tools Discussed/Used Tools: Flanges Nipple shield size: 16 Flange Size:  (decreased mom to 21 flanges) Breast pump type: Double-Electric Breast Pump (mom not pumping, encouraged to pump to protect milk supply) WIC Program: No Pump Review: Setup, frequency, and cleaning;Milk Storage   Consult Status Consult Status: Follow-up Date: 12/12/16 Follow-up type: In-patient    Alfred LevinsLee, Fortunato Nordin Anne 12/11/2016, 2:17 PM

## 2016-12-11 NOTE — Discharge Summary (Signed)
OB Discharge Summary     Patient Name: Megan Scott DOB: 11-02-1991 MRN: [id number]  Date of admission: 12/10/2016 Delivering MD: Renne Musca   Date of discharge: 12/11/2016  Admitting diagnosis: [redacted]w[redacted]d, [place]  Intrauterine pregnancy: [redacted]w[redacted]d     Secondary diagnosis:  Active Problems:   Rupture of membranes with meconium present  Additional problems: none     Discharge diagnosis: Term Pregnancy Delivered                                                                                                Post partum procedures:None  Augmentation: None  Complications: None  Hospital course:  Onset of Labor With Vaginal Delivery     26 y.o. yo G1P1001 at [redacted]w[redacted]d was admitted in Latent Labor on 12/10/2016. Patient had an uncomplicated labor course as follows:  Membrane Rupture Time/Date: 1:00 AM ,12/10/2016   Intrapartum Procedures: Episiotomy: None [1]                                         Lacerations:  2nd degree [3]  Patient had a delivery of a Viable infant. 12/10/2016  Information for the patient's newborn:  Dewana, Ammirati [[id number]]  Delivery Method: Vaginal, Spontaneous Delivery (Filed from Delivery Summary)    Pateint had an uncomplicated postpartum course.  She is ambulating, tolerating a regular diet, passing flatus, and urinating well. Patient is discharged home in stable condition on 12/11/16.    Physical exam  Vitals:   12/10/16 1807 12/10/16 2322 12/11/16 0530 12/11/16 0908  BP: 109/64 113/67 (!) 105/59 107/61  Pulse: (!) 111 (!) 103 96 (!) 101  Resp: 16 18 18    Temp: 98.4 F (36.9 C) 98.2 F (36.8 C) 98.3 F (36.8 C)   TempSrc: Oral Oral Oral   SpO2:  100% 99%   Weight:      Height:       General: alert, cooperative and no distress Lochia: appropriate Uterine Fundus: firm Incision: N/A DVT Evaluation: No evidence of DVT seen on physical exam. Labs: Lab Results  Component Value Date   WBC 10.7 (H) 12/10/2016   HGB 12.8 12/10/2016   HCT 36.9 12/10/2016   MCV 84.6 12/10/2016   PLT 308 12/10/2016   No flowsheet data found.  Discharge instruction: per After Visit Summary and "Baby and Me Booklet".  After visit meds:  Allergies as of 12/11/2016   No Known Allergies     Medication List    TAKE these medications   ibuprofen 600 MG tablet Commonly known as:  ADVIL,MOTRIN Take 1 tablet (600 mg total) by mouth every 6 (six) hours.   prenatal multivitamin Tabs tablet Take 1 tablet by mouth at bedtime.   senna-docusate 8.6-50 MG tablet Commonly known as:  Senokot-S Take 2 tablets by mouth at bedtime as needed for mild constipation.       Diet: routine diet  Activity: Advance as tolerated. Pelvic rest for 6 weeks.   Outpatient follow up:6 weeks Follow up  Appt: Future Appointments Date Time Provider Department Center  12/12/2016 12:00 AM [facility] ROOM [facility] None  01/19/2017 10:00 AM Donette LarryMelanie Bhambri, CNM [facility]   Follow up Visit:No Follow-up on file.  Postpartum contraception: Undecided  Newborn Data: Live born female  Birth Weight: 6 lb 11.8 oz (3055 g) APGAR: 9, 9  Baby Feeding: Breast Disposition:home with mother  Circumcision: Planning for outpatient. [facility].    12/11/2016 Renne Muscaaniel L Erma Joubert, MD

## 2016-12-11 NOTE — Lactation Note (Signed)
This note was copied from a baby's chart. Lactation Consultation Note  Patient Name: Megan Scott WUJWJ'XToday's Date: 12/11/2016 Reason for consult: Follow-up assessment  With this mom  And term baby, now 6327 hours old. Mom did pump earlier, and expressed some colostrum that covered the flanges. I advised mom to collect with her finger, and finger feed to the baby, with the next pumping.Mom states the 21 flanges are a good fit. O/P lactation appointment made for mom  On 1/10 at 2;30 pm.    Maternal Data    Feeding Feeding Type: Breast Fed Length of feed: 30 min (baby was on breast when I walked in the room, on and off briefly, but nutritive sucking noted, clostrum seen in shield)  LATCH Score/Interventions Latch: Repeated attempts needed to sustain latch, nipple held in mouth throughout feeding, stimulation needed to elicit sucking reflex. Intervention(s): Adjust position;Assist with latch;Breast compression  Audible Swallowing: A few with stimulation Intervention(s): Skin to skin;Hand expression  Type of Nipple: Flat (semi inverted)  Comfort (Breast/Nipple): Filling, red/small blisters or bruises, mild/mod discomfort Problem noted: Cracked, bleeding, blisters, bruises (red, very tender nipples, almost bleeding)  Problem noted: Mild/Moderate discomfort Interventions  (Cracked/bleeding/bruising/blister): Double electric pump;Expressed breast milk to nipple Interventions (Mild/moderate discomfort): Post-pump  Hold (Positioning): Assistance needed to correctly position infant at breast and maintain latch. Intervention(s): Breastfeeding basics reviewed;Support Pillows;Position options;Skin to skin  LATCH Score: 5  Lactation Tools Discussed/Used Tools: Flanges Nipple shield size: 16 Flange Size:  (decreased mom to 21 flanges) Breast pump type: Double-Electric Breast Pump (mom not pumping, encouraged to pump to protect milk supply) WIC Program: No Pump Review: Setup, frequency, and  cleaning;Milk Storage   Consult Status Consult Status: Follow-up Date: 12/12/16 Follow-up type: In-patient (o/p consult made for 1/10 at 2;30 pm)    Alfred LevinsLee, Filimon Miranda Anne 12/11/2016, 3:12 PM

## 2016-12-11 NOTE — Lactation Note (Signed)
This note was copied from a baby's chart. Lactation Consultation Note Mom having difficulty latching and having nipple pain. Has flat nipples, round breast, areolas and nipples not compressible. Lt. Nipple w/scab, very tender to even hand express. Mom has #16 NS fit well Has hand pump to pre-pump prior to latching.  Baby has poor latch w/chewing and tongue thrusting. Assisted in football.  Patient Name: Megan Scott'UToday's Date: 12/11/2016 Reason for consult: Follow-up assessment;Difficult latch;Breast/nipple pain   Maternal Data    Feeding Feeding Type: Breast Fed Length of feed: 15 min  LATCH Score/Interventions Latch: Repeated attempts needed to sustain latch, nipple held in mouth throughout feeding, stimulation needed to elicit sucking reflex. Intervention(s): Skin to skin;Teach feeding cues;Waking techniques Intervention(s): Adjust position;Assist with latch;Breast massage;Breast compression  Audible Swallowing: A few with stimulation Intervention(s): Skin to skin;Hand expression Intervention(s): Alternate breast massage  Type of Nipple: Flat Intervention(s): Shells;Hand pump  Comfort (Breast/Nipple): Engorged, cracked, bleeding, large blisters, severe discomfort Problem noted: Cracked, bleeding, blisters, bruises Intervention(s): Hand pump;Expressed breast milk to nipple  Problem noted: Mild/Moderate discomfort;Cracked, bleeding, blisters, bruises Interventions  (Cracked/bleeding/bruising/blister): Expressed breast milk to nipple;Hand pump Interventions (Mild/moderate discomfort): Comfort gels;Breast shields;Hand massage;Hand expression  Hold (Positioning): Assistance needed to correctly position infant at breast and maintain latch. Intervention(s): Breastfeeding basics reviewed;Support Pillows;Position options;Skin to skin  LATCH Score: 4  Lactation Tools Discussed/Used Tools: Shells;Nipple Shields;Pump;Comfort gels Nipple shield size: 16 Shell Type:  Inverted Breast pump type: Manual Pump Review: Setup, frequency, and cleaning;Milk Storage   Consult Status Consult Status: Follow-up Date: 12/11/16 Follow-up type: In-patient    Sophiamarie Nease, Diamond NickelLAURA G 12/11/2016, 7:59 AM

## 2016-12-11 NOTE — Discharge Instructions (Signed)

## 2016-12-12 ENCOUNTER — Ambulatory Visit: Payer: Self-pay

## 2016-12-12 ENCOUNTER — Inpatient Hospital Stay (HOSPITAL_COMMUNITY): Admission: RE | Admit: 2016-12-12 | Payer: Self-pay | Source: Ambulatory Visit

## 2016-12-12 NOTE — Lactation Note (Signed)
This note was copied from a baby's chart. Lactation Consultation Note  Patient Name: Megan Scott RDEYC'X Date: 12/12/2016 Reason for consult: Follow-up assessment   Follow-up consult at 48 hrs; GA 40.3; BW 6 lbs, 11.8 oz.  Mom is a P1 Infant has breastfed x9 (14-30 min) + formula x2 (8-10 ml); voids-4 in 24 hrs/ 6 life; stools-3 in 24 hrs/ 5 life.  LS-7.   Mom has been using #16 Nipple Shield with feedings and #21 flanges for DEBP.  Firm breast tissue; short shafted nipples semi-flat with compressions.   Mom states she has not pumped today but yesterday only received few drops.   Infant on left breast football hold when [facility] entered room.  LC observed last 10 minutes of feeding.  Frequent swallows heard with milk visible in corners of mouth.  LC adjusted infant's body to be in alignment conducive for milk transfer (facing mom's breast); after aligning, more frequent swallows heard.  When infant came off breast clear colostrum noted in breast shield.   LC spoke to MD about weight loss; LC advised having baby re-weighed today- weight loss midnight 1/5 2.7%; 2300 1/5 6.5% (4% loss in 24 hrs); at today's requested re-weight @ 1200 1/6 7.4% (1% loss in 12 hrs). Appears weight loss might be stabilizing, MD spoke with parents and they opted to stay another night to work on feedings.  LC in room when MD visited. Baby showing cues and latched to right breast football hold with minimal assistance from [facility] (mom needed verbal cues for correct body alignment).  Reminded mom how to support breast and baby and chin tug for more comfortable latch.  Infant fed for an additional 10+ minutes.  Frequent swallows heard at beginning of feeding.  Baby was still on breast when [facility] left room.   Instructed to post-pump after breastfeeding using DEBP and use any EBM to prefill nipple shield for next feeding and to feed at breast with curved-tip syringe as additional supplementation.   Advised that if EBM is minimal with pumping  then to supplement with formula 3-4 times over the next 24 hrs using formula supplementation guidelines.  Guidelines given to parents and reviewed with parents.   Mom does not have insurance or WIC or Medicaid and does not have pump at home.  Discussed pump rental options and parents stated they would think about it.  Has HP and DEBP kit in room.   2-week rental paperwork given to parents and explained how to complete by tomorrow morning if they would like to do a 2-week rental.   Comfort gels given to mom for sore nipples; explained how to use.   Mom has [facility] outpatient appointment scheduled for Wed 1/10 @ 2:30p. Baby has Peds appt scheduled for Mon 1/8 @ 4:00p. Encouraged to call for questions or assistance as needed.   Will follow-up tomorrow on weight loss and breastfeeding progress.      Maternal Data Has patient been taught Hand Expression?: Yes  Feeding Feeding Type: Breast Fed Length of feed: 30 min  LATCH Score/Interventions Latch: Grasps breast easily, tongue down, lips flanged, rhythmical sucking. Intervention(s): Breast compression;Adjust position  Audible Swallowing: Spontaneous and intermittent  Type of Nipple: Flat (semi-flat; short shaft; does erect with stimulation) Intervention(s): Double electric pump;Hand pump Intervention(s):  (nipple shield)  Comfort (Breast/Nipple): Filling, red/small blisters or bruises, mild/mod discomfort Problem noted: Cracked, bleeding, blisters, bruises Intervention(s):  (comfort gels)  Problem noted: Mild/Moderate discomfort Interventions (Mild/moderate discomfort): Comfort gels  Hold (Positioning): Assistance  needed to correctly position infant at breast and maintain latch. (minimal assistance with positioning) Intervention(s): Breastfeeding basics reviewed;Skin to skin;Position options  LATCH Score: 7  Lactation Tools Discussed/Used Tools: Nipple Shields Nipple shield size: 16   Consult Status Consult Status: Follow-up  (potentially staying another day) Date: 12/13/16 Follow-up type: In-patient    Merlene Laughter 12/12/2016, 12:01 PM

## 2016-12-13 ENCOUNTER — Ambulatory Visit: Payer: Self-pay

## 2016-12-13 NOTE — Lactation Note (Signed)
This note was copied from a baby's chart. Lactation Consultation Note: Mother has infant latched to the (R) breast in football hold when I entered the room. Observed that mother was using a nipple shield. Infant was suckling with a good pattern. Observed swallowing. Mother states that she is seeing milk in the nipple shield. Her breast are filling. Mother declined to rent the 2 week rental. Mother does have a hand pump if needed. Mother advised to continue to offer infant ebm/formula after feedings. Instruct to breastfeed infant 8-12 times in 24 hours. Suggested that mother do good breast massage to stimulate milk volume. Advised mother to use ice to prevent engorgement. S/S of Mastitis reviewed . Mother has an outpatient visit scheduled. She denies having any concerns . Mother is aware of available lactation services.   Patient Name: Megan France RavensJolly Eiben ZOXWR'UToday's Date: 12/13/2016 Reason for consult: Follow-up assessment   Maternal Data    Feeding Feeding Type: Breast Fed  LATCH Score/Interventions Latch: Grasps breast easily, tongue down, lips flanged, rhythmical sucking.  Audible Swallowing: Spontaneous and intermittent  Type of Nipple: Flat  Comfort (Breast/Nipple): Filling, red/small blisters or bruises, mild/mod discomfort     Hold (Positioning): No assistance needed to correctly position infant at breast.  LATCH Score: 8  Lactation Tools Discussed/Used     Consult Status Consult Status: Follow-up Date: 12/16/16 Follow-up type: Out-patient    Stevan BornKendrick, Aydeen Blume [facility] 12/13/2016, 11:51 AM

## 2016-12-16 ENCOUNTER — Ambulatory Visit (HOSPITAL_COMMUNITY): Payer: Self-pay

## 2017-01-19 ENCOUNTER — Ambulatory Visit: Payer: Self-pay | Admitting: Certified Nurse Midwife

## 2017-05-07 NOTE — Addendum Note (Signed)
Addendum  created 05/07/17 [id number] by Shelton SilvasHollis, Kevin D, MD   Sign clinical note

## 2017-05-07 NOTE — Anesthesia Postprocedure Evaluation (Signed)
Anesthesia Post Note  Patient: Megan Scott  Procedure(s) Performed: * No procedures listed *     Anesthesia Post Evaluation  Last Vitals: There were no vitals filed for this visit.  Last Pain: There were no vitals filed for this visit.               Megan Scott

## 2017-06-19 IMAGING — US US MFM OB FOLLOW-UP
1 series · 14 of 28 positions shown · non-contrast
Comparison: none

[Series 1: us mfm ob follow-up · 50 acquisitions, 14 frames shown]
[im 2/50]
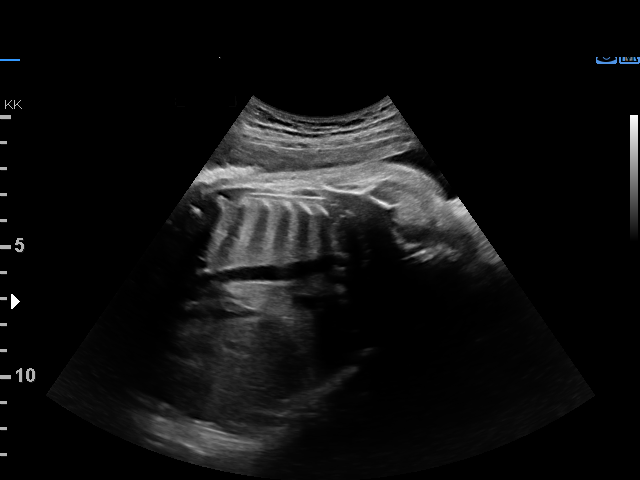
[im 6/50]
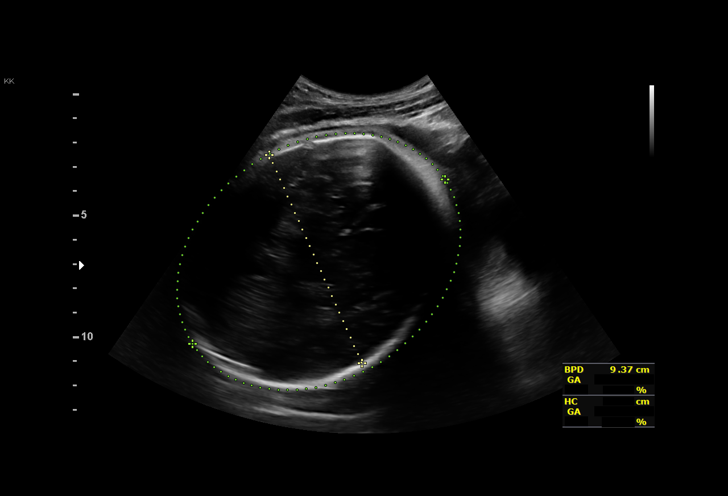
[im 10/50]
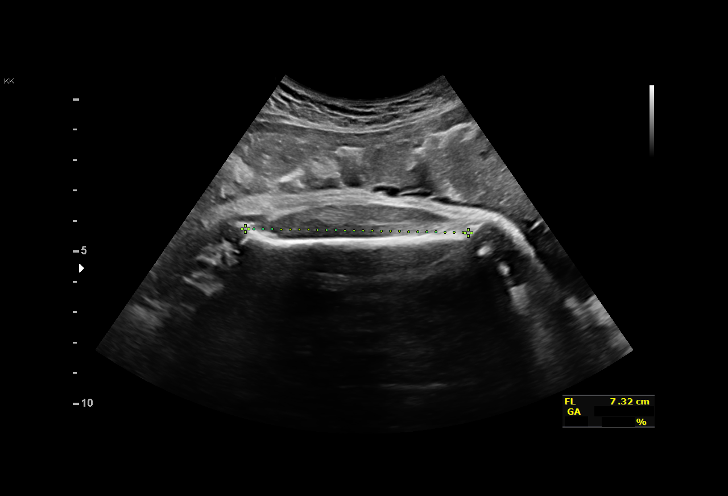
[im 13/50]
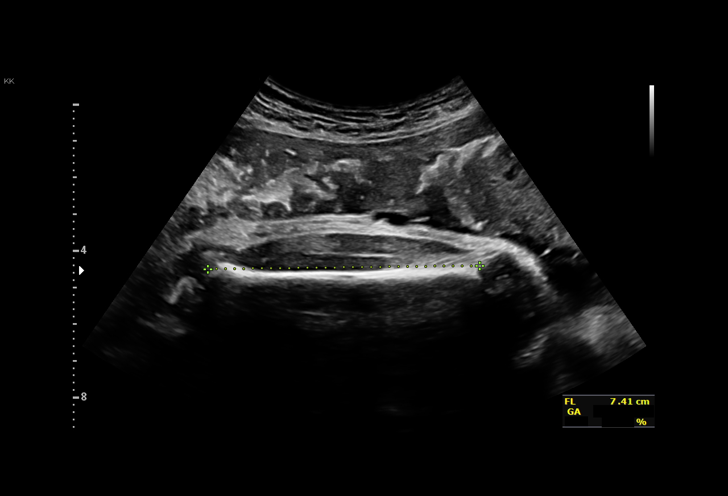
[im 17/50]
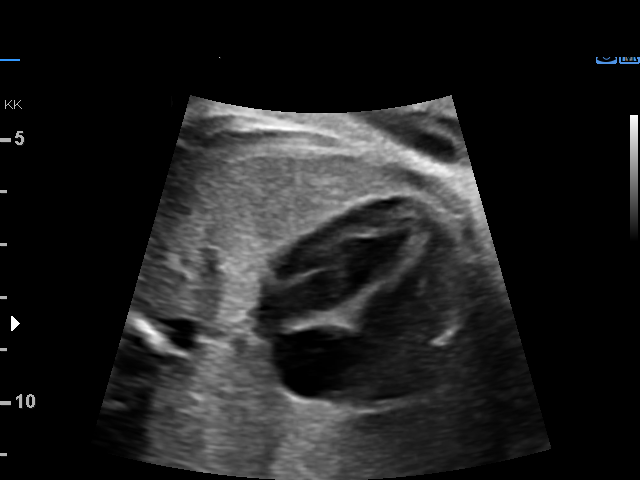
[im 20/50]
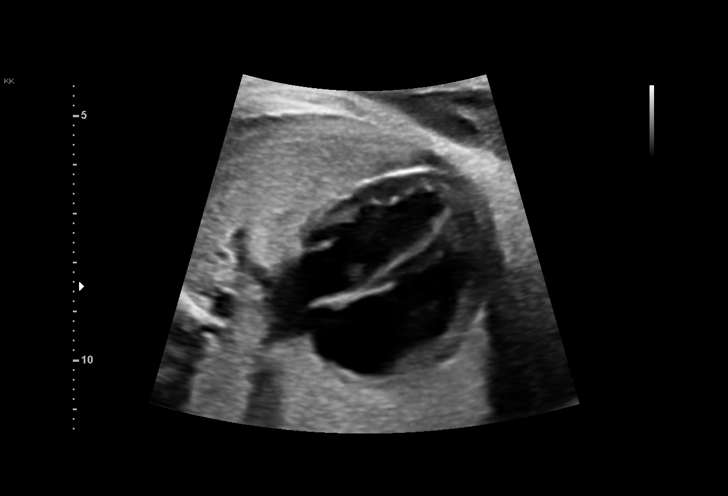
[im 24/50]
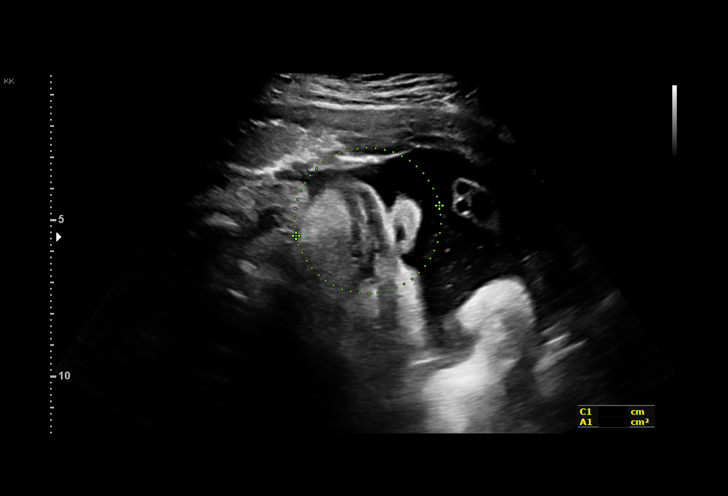
[im 28/50]
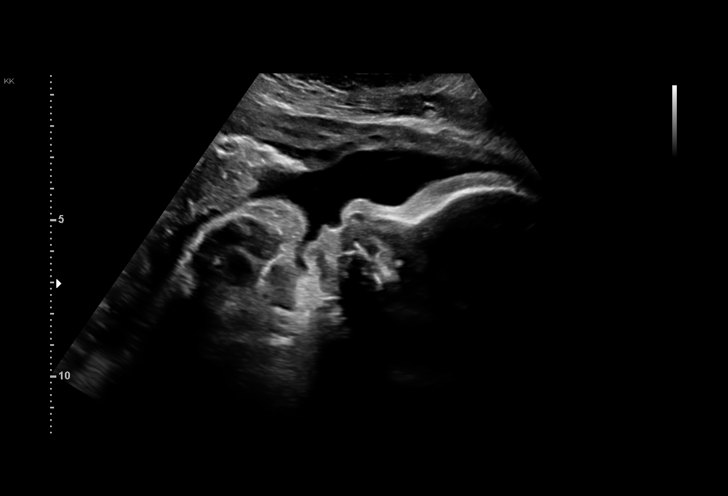
[im 31/50]
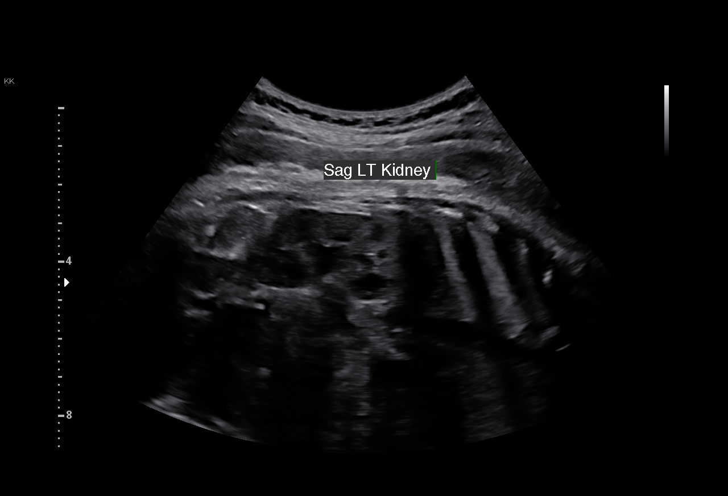
[im 35/50]
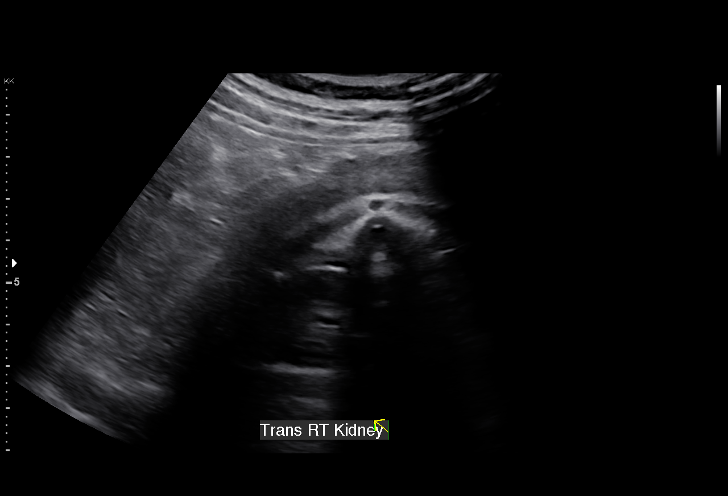
[im 39/50]
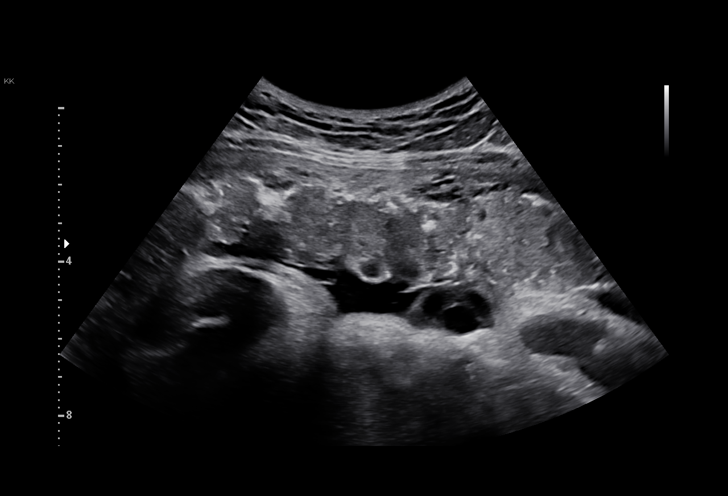
[im 42/50]
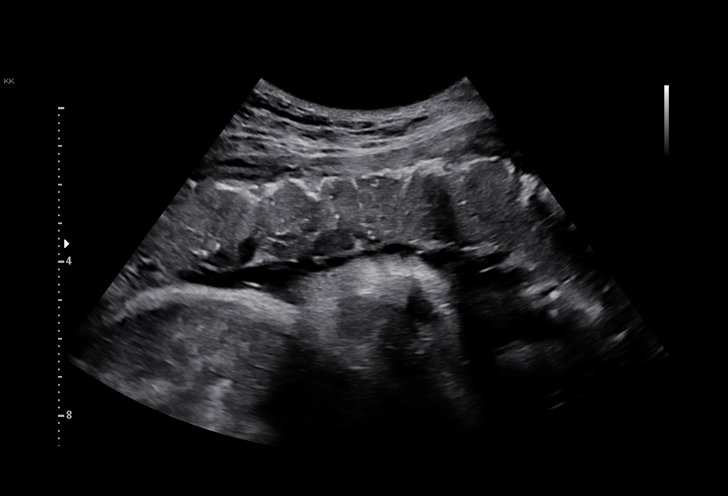
[im 46/50]
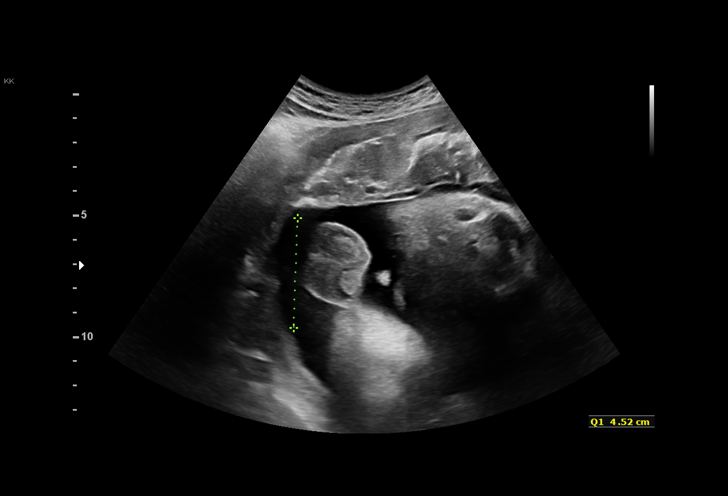
[im 50/50]
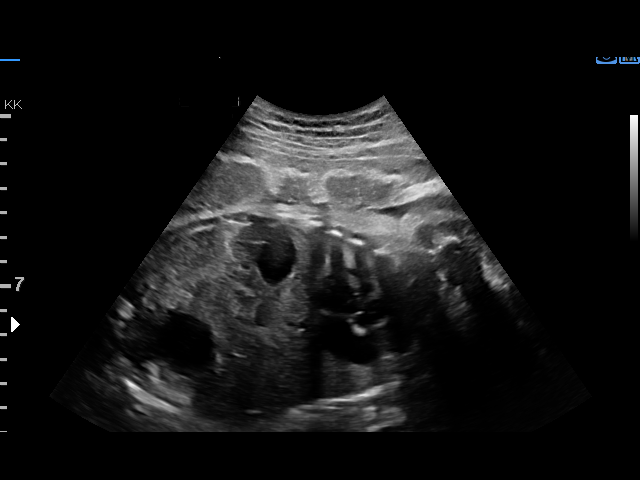

[14 of 28 positions shown; findings below may reference images not displayed]

Indications

38 weeks gestation of pregnancy
Abnormal ultrasound finding on antenatal
screening of mother (oligohydramnios and
IUGR on outside US)
OB History

Gravidity:    1
Fetal Evaluation

Num Of Fetuses:     1
Fetal Heart         140
Rate(bpm):
Cardiac Activity:   Observed
Presentation:       Cephalic
Placenta:           Anterior, above cervical os
P. Cord Insertion:  Not well visualized

Amniotic Fluid
AFI FV:      Subjectively within normal limits

AFI Sum(cm)     %Tile       Largest Pocket(cm)
14.12           55

RUQ(cm)                     LUQ(cm)        LLQ(cm)
4.52
Biometry
BPD:        94  mm     G. Age:  38w 2d         71  %    CI:        74.79   %   70 - 86
FL/HC:      21.5   %   20.6 -
HC:      344.9  mm     G. Age:  39w 6d         66  %    HC/AC:      1.05       0.87 -
AC:      328.7  mm     G. Age:  36w 5d         22  %    FL/BPD:     78.8   %   71 - 87
FL:       74.1  mm     G. Age:  38w 0d         39  %    FL/AC:      22.5   %   20 - 24

Est. FW:    6018  gm      7 lb 3 oz     62  %
Gestational Age

Clinical EDD:  38w 4d                                        EDD:   12/07/16
U/S Today:     38w 2d                                        EDD:   12/09/16
Best:          38w 4d    Det. By:   Clinical EDD             EDD:   12/07/16
Anatomy

Cranium:               Appears normal         Aortic Arch:            Not well visualized
Cavum:                 Not well visualized    Ductal Arch:            Previously seen
Ventricles:            Previously seen        Diaphragm:              Appears normal
Choroid Plexus:        Not well visualized    Stomach:                Appears normal, left
sided
Cerebellum:            Not well visualized    Abdomen:                Appears normal
Posterior Fossa:       Not well visualized    Abdominal Wall:         Not well visualized
Nuchal Fold:           Not applicable (>20    Cord Vessels:           Previously seen
wks GA)
Face:                  Appears normal         Kidneys:                Appear normal
(orbits and profile)
Lips:                  Appears normal         Bladder:                Appears normal
Thoracic:              Appears normal         Spine:                  Not well visualized
Heart:                 Appears normal         Upper Extremities:      Not well visualized
(4CH, axis, and situs
RVOT:                  Not well visualized    Lower Extremities:      Not well visualized
LVOT:                  Not well visualized

Other:  Technically difficult due to advanced GA and fetal position.
Cervix Uterus Adnexa

Cervix
Not visualized (advanced GA >76wks)
Impression

Singleton intrauterine pregnancy  38 [DATE] weeks with
suspected IUGR on follow up interval growth:

active singleton fetus
today's biometry demonstrates appropriate interval growth
with EFW at the 62nd%'le
no structural defects demonstrated
Amniotic fluid volume is appropriate for gestational age by AFI
placenta is implanted along the anterior uterine wall without
previa
Recommendations

Follow up as clinically indicated.
